# Patient Record
Sex: Female | Born: 1955 | Race: Black or African American | Hispanic: No | Smoking: Current every day smoker
Health system: Southern US, Community
[De-identification: ages and names within clinical notes are randomized; demographics above are authoritative.]

## PROBLEM LIST (undated history)

## (undated) DIAGNOSIS — E7800 Pure hypercholesterolemia, unspecified: Secondary | ICD-10-CM

## (undated) DIAGNOSIS — I10 Essential (primary) hypertension: Secondary | ICD-10-CM

## (undated) DIAGNOSIS — M797 Fibromyalgia: Secondary | ICD-10-CM

## (undated) HISTORY — PX: ABDOMINAL HYSTERECTOMY: SHX81

---

## 2009-03-25 ENCOUNTER — Emergency Department (HOSPITAL_BASED_OUTPATIENT_CLINIC_OR_DEPARTMENT_OTHER): Admission: EM | Admit: 2009-03-25 | Discharge: 2009-03-25 | Payer: Self-pay | Admitting: Emergency Medicine

## 2009-03-25 ENCOUNTER — Ambulatory Visit: Payer: Self-pay | Admitting: Diagnostic Radiology

## 2009-03-27 ENCOUNTER — Ambulatory Visit: Payer: Self-pay | Admitting: Diagnostic Radiology

## 2009-03-27 ENCOUNTER — Emergency Department (HOSPITAL_BASED_OUTPATIENT_CLINIC_OR_DEPARTMENT_OTHER): Admission: EM | Admit: 2009-03-27 | Discharge: 2009-03-27 | Payer: Self-pay | Admitting: Emergency Medicine

## 2010-05-28 LAB — BASIC METABOLIC PANEL
BUN: 16 mg/dL (ref 6–23)
Chloride: 105 mEq/L (ref 96–112)
GFR calc Af Amer: >60 mL/min (ref >60)
GFR calc non Af Amer: >60 mL/min (ref >60)
Potassium: 4.1 mEq/L (ref 3.5–5.1)
Sodium: 144 mEq/L (ref 135–145)

## 2010-11-19 IMAGING — CT CT CHEST W/ CM
2 of 3 series · 15 of 36 positions shown, 18 images · IV contrast (APPLIED)
Comparison: Chest x-ray of 03/25/2009

Addendum Begins

After discussing this case with Dr. Keiko, there is slight cortical
irregularity of the anterior left third rib consistent with a
nondisplaced left anterior third rib fracture.
Addendum Ends
CLINICAL DATA: Fell yesterday, severe mid chest pain, neck pain,
smoking history
CT CHEST WITH CONTRAST
TECHNIQUE: Multidetector CT imaging of the chest was performed
following the standard protocol during bolus administration of
intravenous contrast.
Contrast: 80 ml 6mnipaque-GSS

[Series 2: chest 5.0 b31f · axial · 0.61mm/px · z∈[-134,+96]mm · 12 of 54 slices shown, 15 images]
[im 4/54  mediastinal]
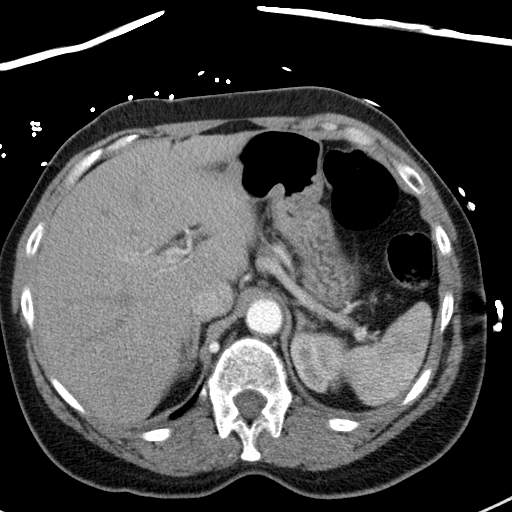
[im 4/54  lung]
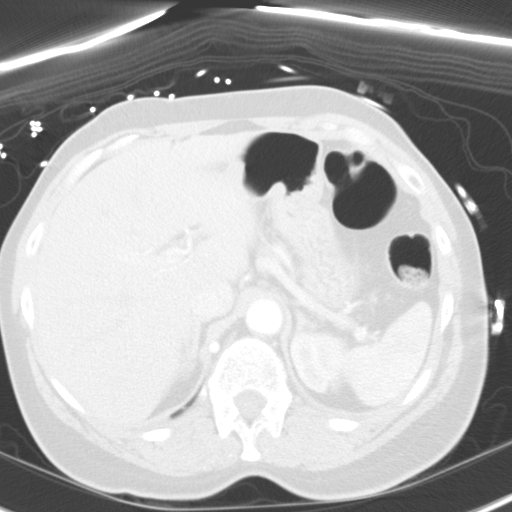
[im 8/54  lung]
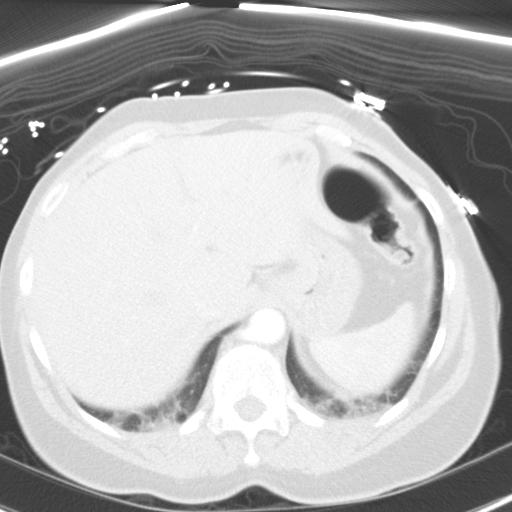
[im 12/54  lung]
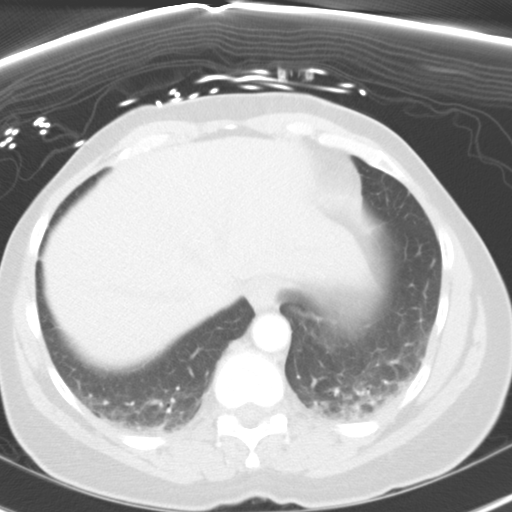
[im 16/54  lung]
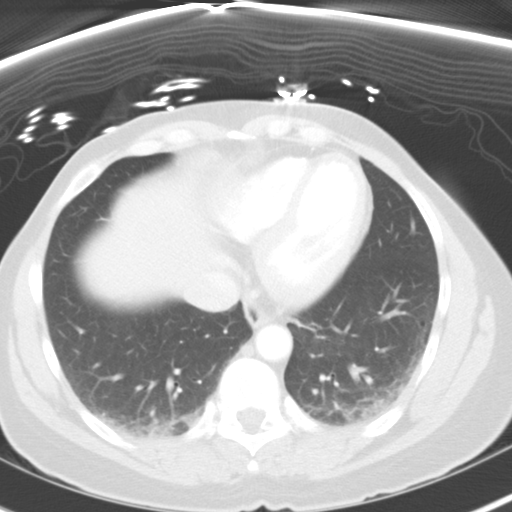
[im 20/54  mediastinal]
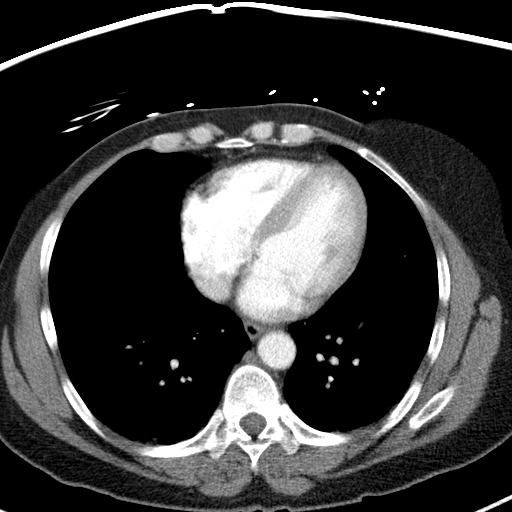
[im 20/54  lung]
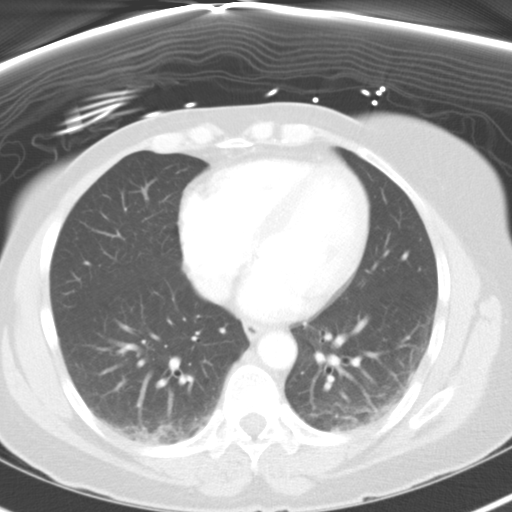
[im 24/54  lung]
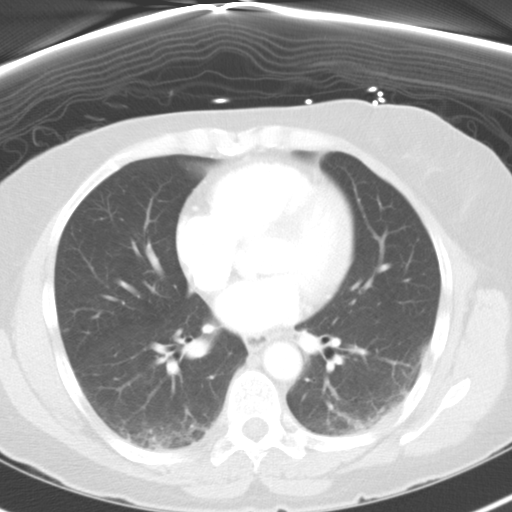
[im 30/54  lung]
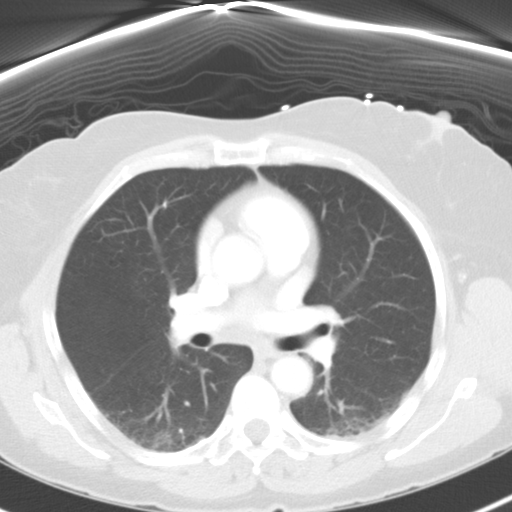
[im 34/54  lung]
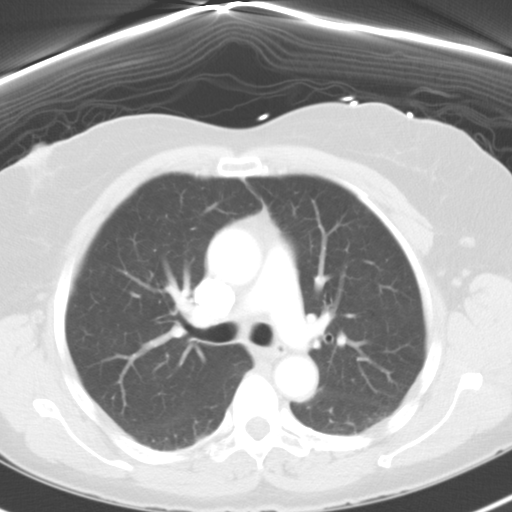
[im 38/54  mediastinal]
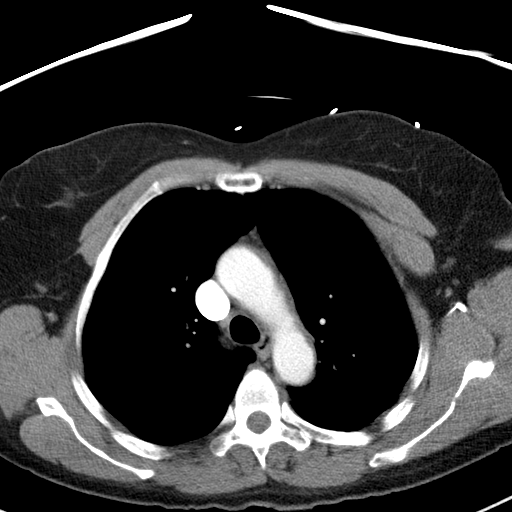
[im 38/54  lung]
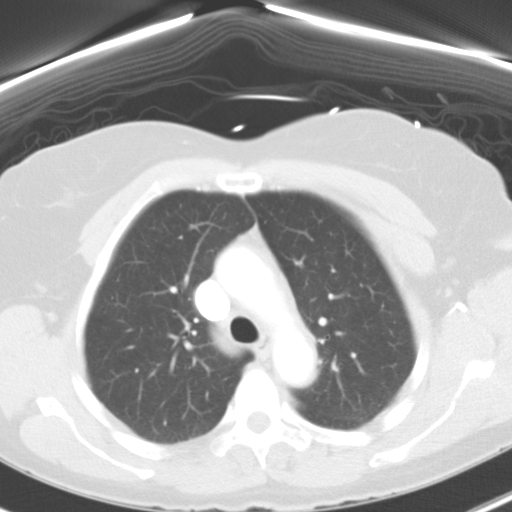
[im 42/54  lung]
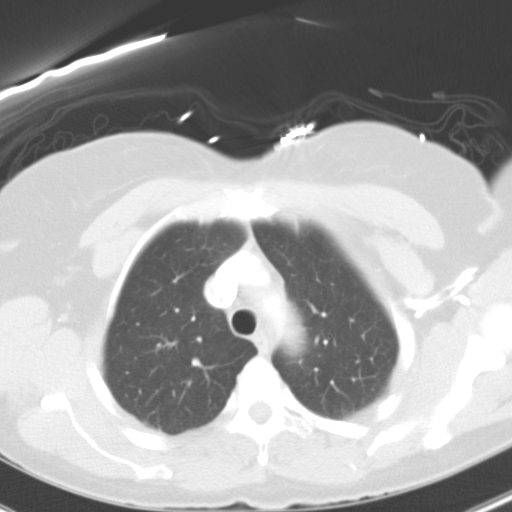
[im 46/54  lung]
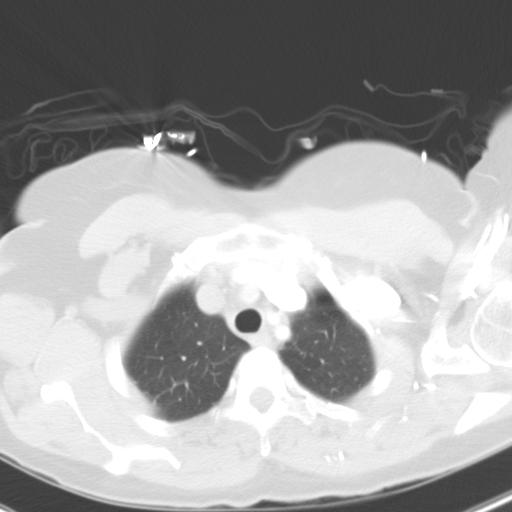
[im 50/54  lung]
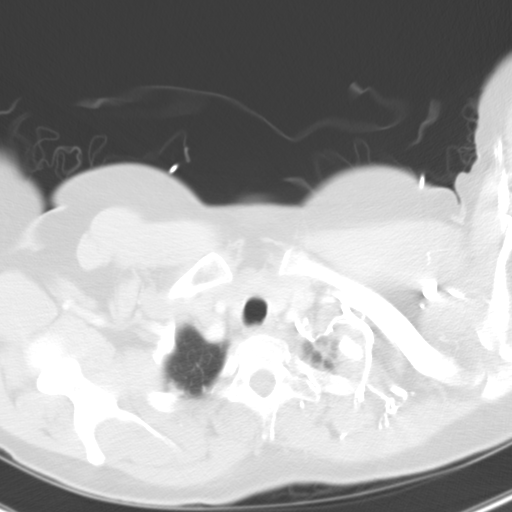

[Series 6: chest 3.0 coronal · coronal · 0.54mm/px · 3 of 73 slices shown]
[im 15/73  lung]
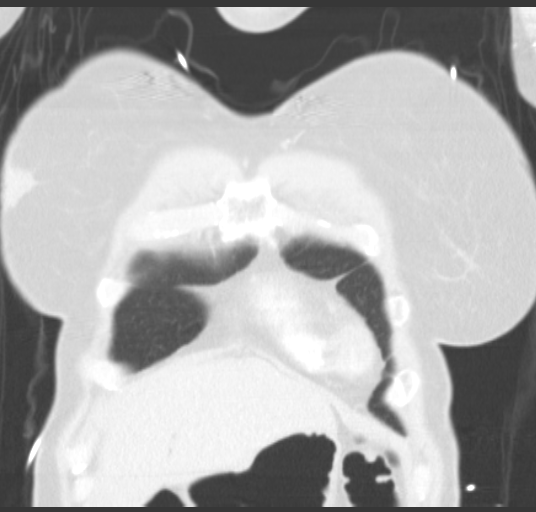
[im 29/73  lung]
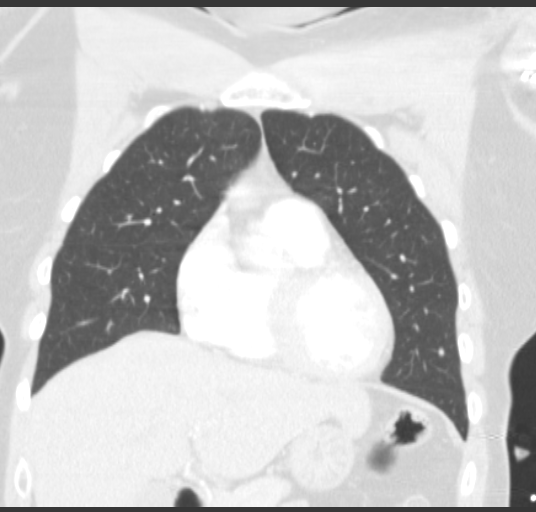
[im 44/73  lung]
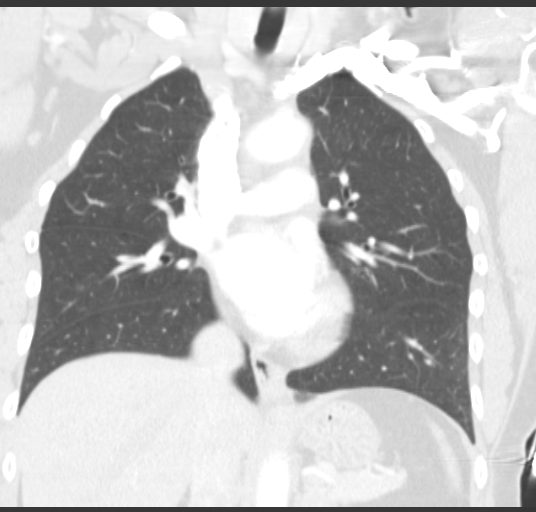

[15 of 36 positions shown; findings below may reference images not displayed]

FINDINGS: There is mild bibasilar dependent linear atelectasis
present.  No pneumothorax is seen and no pleural effusion is noted.

On soft tissue window images no mediastinal or hilar adenopathy is
seen.  The pulmonary arteries and thoracic aorta opacify with no
acute abnormality noted.  No pericardial effusion is seen.  What is
seen of the liver and spleen on images through the upper abdomen
show no acute abnormality.  Bone window images show no acute
fracture.  The airway appears normal.
IMPRESSION: No significant abnormality on CT of the chest.  Mild bibasilar
dependent linear atelectasis is noted.  No fracture is seen.

## 2022-04-12 ENCOUNTER — Ambulatory Visit
Admission: EM | Admit: 2022-04-12 | Discharge: 2022-04-12 | Disposition: A | Discharge status: Home / Self Care | Payer: Medicare HMO | Attending: Emergency Medicine | Admitting: Emergency Medicine

## 2022-04-12 DIAGNOSIS — J441 Chronic obstructive pulmonary disease with (acute) exacerbation: Secondary | ICD-10-CM | POA: Diagnosis not present

## 2022-04-12 DIAGNOSIS — Z1152 Encounter for screening for COVID-19: Secondary | ICD-10-CM | POA: Diagnosis not present

## 2022-04-12 DIAGNOSIS — B349 Viral infection, unspecified: Secondary | ICD-10-CM | POA: Insufficient documentation

## 2022-04-12 HISTORY — DX: Essential (primary) hypertension: I10

## 2022-04-12 HISTORY — DX: Fibromyalgia: M79.7

## 2022-04-12 HISTORY — DX: Pure hypercholesterolemia, unspecified: E78.00

## 2022-04-12 LAB — POCT INFLUENZA A/B
Influenza A, POC: NEGATIVE
Influenza B, POC: NEGATIVE

## 2022-04-12 LAB — SARS CORONAVIRUS 2 BY RT PCR: SARS Coronavirus 2 by RT PCR: NEGATIVE

## 2022-04-12 MED ORDER — GUAIFENESIN 400 MG PO TABS: ORAL_TABLET | ORAL | 0 refills | Status: AC

## 2022-04-12 MED ORDER — IBUPROFEN 400 MG PO TABS: 400.0000 mg | ORAL_TABLET | Freq: Three times a day (TID) | ORAL | 0 refills | Status: AC | PRN

## 2022-04-12 MED ORDER — ALBUTEROL SULFATE HFA 108 (90 BASE) MCG/ACT IN AERS: 2.0000 | INHALATION_SPRAY | Freq: Four times a day (QID) | RESPIRATORY_TRACT | 1 refills | Status: AC | PRN

## 2022-04-12 MED ORDER — PROMETHAZINE-DM 6.25-15 MG/5ML PO SYRP: 5.0000 mL | ORAL_SOLUTION | Freq: Every evening | ORAL | 0 refills | Status: AC | PRN

## 2022-04-12 MED ORDER — ONDANSETRON 4 MG PO TBDP
4.0000 mg | ORAL_TABLET | Freq: Once | ORAL | Status: AC
  Administered 2022-04-12: 4 mg via ORAL

## 2022-04-12 NOTE — ED Triage Notes (Signed)
Pt presents to [facility] with co of congestion, runny nose, cough for 3 days and new onset of nausea and diaphoresis this evening. No otc medications

## 2022-04-12 NOTE — ED Provider Notes (Signed)
[facility]    CSN: [id number] Arrival date & time: 04/12/22  1905    HISTORY  No chief complaint on file.  HPI Samantha Chavez is a pleasant, 67 y.o. female who presents to urgent care today. Pt co of congestion, runny nose, cough for 3 days and new onset of nausea, abdominal pain and diaphoresis this evening. No otc medications.  Patient reports a history of COPD, not currently taking any medications for this.  The history is provided by the patient.   No past medical history on file. There are no problems to display for this patient.  *** The histories are not reviewed yet. Please review them in the "History" navigator section and refresh this [place]. OB History   No obstetric history on file.    Home Medications    Prior to Admission medications   Not on File    Family History No family history on file. Social History   Allergies   Patient has no allergy information on record.  Review of Systems Review of Systems Pertinent findings revealed after performing a 14 point review of systems has been noted in the history of present illness.  Physical Exam Triage Vital Signs ED Triage Vitals  Enc Vitals Group     BP 01/05/21 0827 (!) 147/82     Pulse Rate 01/05/21 0827 72     Resp 01/05/21 0827 18     Temp 01/05/21 0827 98.3 F (36.8 C)     Temp Source 01/05/21 0827 Oral     SpO2 01/05/21 0827 98 %     Weight --      Height --      Head Circumference --      Peak Flow --      Pain Score 01/05/21 0826 5     Pain Loc --      Pain Edu? --      Excl. in [place]? --   No data found.  Updated Vital Signs There were no vitals taken for this visit.  Physical Exam Vitals and nursing note reviewed.  Constitutional:      General: She is not in acute distress.    Appearance: Normal appearance. She is not ill-appearing.  HENT:     Head: Normocephalic and atraumatic.     Salivary Glands: Right salivary gland is not diffusely enlarged or tender. Left  salivary gland is not diffusely enlarged or tender.     Right Ear: Tympanic membrane, ear canal and external ear normal. No drainage. No middle ear effusion. There is no impacted cerumen. Tympanic membrane is not erythematous or bulging.     Left Ear: Tympanic membrane, ear canal and external ear normal. No drainage.  No middle ear effusion. There is no impacted cerumen. Tympanic membrane is not erythematous or bulging.     Nose: Nose normal. No nasal deformity, septal deviation, mucosal edema, congestion or rhinorrhea.     Right Turbinates: Not enlarged, swollen or pale.     Left Turbinates: Not enlarged, swollen or pale.     Right Sinus: No maxillary sinus tenderness or frontal sinus tenderness.     Left Sinus: No maxillary sinus tenderness or frontal sinus tenderness.     Mouth/Throat:     Lips: Pink. No lesions.     Mouth: Mucous membranes are moist. No oral lesions.     Pharynx: Oropharynx is clear. Uvula midline. No posterior oropharyngeal erythema or uvula swelling.     Tonsils: No tonsillar exudate. 0 on  the right. 0 on the left.  Eyes:     General: Lids are normal.        Right eye: No discharge.        Left eye: No discharge.     Extraocular Movements: Extraocular movements intact.     Conjunctiva/sclera: Conjunctivae normal.     Right eye: Right conjunctiva is not injected.     Left eye: Left conjunctiva is not injected.  Neck:     Trachea: Trachea and phonation normal.  Cardiovascular:     Rate and Rhythm: Normal rate and regular rhythm.     Pulses: Normal pulses.     Heart sounds: Normal heart sounds. No murmur heard.    No friction rub. No gallop.  Pulmonary:     Effort: Pulmonary effort is normal. No accessory muscle usage, prolonged expiration or respiratory distress.     Breath sounds: Normal breath sounds. No stridor, decreased air movement or transmitted upper airway sounds. No decreased breath sounds, wheezing, rhonchi or rales.  Chest:     Chest wall: No  tenderness.  Musculoskeletal:        General: Normal range of motion.     Cervical back: Normal range of motion and neck supple. Normal range of motion.  Lymphadenopathy:     Cervical: No cervical adenopathy.  Skin:    General: Skin is warm and dry.     Findings: No erythema or rash.  Neurological:     General: No focal deficit present.     Mental Status: She is alert and oriented to person, place, and time.  Psychiatric:        Mood and Affect: Mood normal.        Behavior: Behavior normal.     Visual Acuity Right Eye Distance:   Left Eye Distance:   Bilateral Distance:    Right Eye Near:   Left Eye Near:    Bilateral Near:     [facility] Couse / Diagnostics / Procedures:     Radiology No results found.  Procedures Procedures (including critical care time) EKG  Pending results:  Labs Reviewed - No data to display  Medications Ordered in UC: Medications - No data to display  UC Diagnoses / Final Clinical Impressions(s)   I have reviewed the triage vital signs and the nursing notes.  Pertinent labs & imaging results that were available during my care of the patient were reviewed by me and considered in my medical decision making (see chart for details).    Final diagnoses:  None   *** Please see discharge instructions below for further details of plan of care as provided to patient. ED Prescriptions   None    PDMP not reviewed this encounter.  Disposition Upon Discharge:  Condition: stable for discharge home Home: take medications as prescribed; routine discharge instructions as discussed; follow up as advised.  Patient presented with an acute illness with associated systemic symptoms and significant discomfort requiring urgent management. In my opinion, this is a condition that a prudent lay person (someone who possesses an average knowledge of health and medicine) may potentially expect to result in complications if not addressed urgently such as respiratory  distress, impairment of bodily function or dysfunction of bodily organs.   Routine symptom specific, illness specific and/or disease specific instructions were discussed with the patient and/or caregiver at length.   As such, the patient has been evaluated and assessed, work-up was performed and treatment was provided in alignment with urgent care  protocols and evidence based medicine.  Patient/parent/caregiver has been advised that the patient may require follow up for further testing and treatment if the symptoms continue in spite of treatment, as clinically indicated and appropriate.  If the patient was tested for COVID-19, Influenza and/or RSV, then the patient/parent/guardian was advised to isolate at home pending the results of his/her diagnostic coronavirus test and potentially longer if they're positive. I have also advised pt that if his/her COVID-19 test returns positive, it's recommended to self-isolate for at least 10 days after symptoms first appeared AND until fever-free for 24 hours without fever reducer AND other symptoms have improved or resolved. Discussed self-isolation recommendations as well as instructions for household member/close contacts as per the [facility] and [place] DHHS, and also gave patient the [place] packet with this information.  Patient/parent/caregiver has been advised to return to the [facility] or PCP in 3-5 days if no better; to PCP or the Emergency Department if new signs and symptoms develop, or if the current signs or symptoms continue to change or worsen for further workup, evaluation and treatment as clinically indicated and appropriate  The patient will follow up with their current PCP if and as advised. If the patient does not currently have a PCP we will assist them in obtaining one.   The patient may need specialty follow up if the symptoms continue, in spite of conservative treatment and management, for further workup, evaluation, consultation and treatment as clinically  indicated and appropriate.  Patient/parent/caregiver verbalized understanding and agreement of plan as discussed.  All questions were addressed during visit.  Please see discharge instructions below for further details of plan.  Discharge Instructions: Discharge Instructions   None     This office note has been dictated using Dragon speech recognition software.  Unfortunately, this method of dictation can sometimes lead to typographical or grammatical errors.  I apologize for your inconvenience in advance if this occurs.  Please do not hesitate to reach out to me if clarification is needed.

## 2022-04-12 NOTE — Discharge Instructions (Addendum)
Your rapid influenza antigen test today was negative.  No further influenza testing is indicated.  You received a COVID-19 PCR test today.  The result of your COVID-19 test will be posted to your MyChart once it is complete, typically this takes 18-24 hours.      If your COVID-19 PCR test is positive, you will be contacted by phone.  Please discuss with the callback nurse whether or not you would benefit from antiviral therapy treatment for COVID-19.     If your COVID-19 PCR test is negative, please consider retesting in the next 2 to 3 days, particularly if you are not feeling any better.  You are welcome to return here to urgent care to have it done or you can take a home COVID-19 test.    If both your COVID-19 tests are negative, then you can safely assume that your illness is due to one of the many less serious illnesses circulating in our community right now.  Conservative care is recommended with rest, drinking plenty of clear fluids, eating only when hungry, taking supportive medications for your symptoms and avoiding being around other people.  Please remain at home until you are fever free for 24 hours without the use of antifever medications such as Tylenol and ibuprofen.     Based on my physical exam findings and the history you have provided  today, I do not recommend antibiotics at this time.  I do not believe the risks and side effects of antibiotics would outweigh any minimal benefit that they might provide.       Please read below to learn more about the medications, dosages and frequencies that I recommend to help alleviate your symptoms and to get you feeling better soon:  ProAir, Ventolin, Proventil (albuterol): This inhaled medication contains a short acting beta agonist bronchodilator.  This medication works on the smooth muscle that opens and constricts of your airways by relaxing the muscle.  The result of relaxation of the smooth muscle is increased air movement and improved  work of breathing.  This is a short acting medication that can be used every 4-6 hours as needed for increased work of breathing, shortness of breath, wheezing and excessive coughing.  I have provided you with a prescription.   Advil, Motrin (ibuprofen): This is a good anti-inflammatory medication which addresses aches, pains and inflammation of the upper airways that causes sinus and nasal congestion as well as in the lower airways which makes your cough feel tight and sometimes burn.  I recommend that you take between 400 to 600 mg every 6-8 hours as needed.      Robitussin, Mucinex (guaifenesin): This is an expectorant.  This helps break up chest congestion and loosen up thick nasal drainage making phlegm and drainage more liquid and therefore easier to remove.  I recommend being 400 mg three times daily as needed.      Promethazine DM: Promethazine is both a nasal decongestant and an antinausea medication that makes most patients feel fairly sleepy.  The DM is dextromethorphan, a cough suppressant found in many over-the-counter cough medications.  Please take 5 mL before bedtime to minimize your cough which will help you sleep better.  I have sent a prescription for this medication to your pharmacy.   Please follow-up within the next 5-7 days either with your primary care provider or urgent care if your symptoms do not resolve.  If you do not have a primary care provider, we will assist you in  finding one.        Thank you for visiting urgent care today.  We appreciate the opportunity to participate in your care.

## 2022-04-13 ENCOUNTER — Encounter: Payer: Self-pay | Admitting: Emergency Medicine
